# Patient Record
Sex: Male | Born: 1980 | Race: Black or African American | Hispanic: No | Marital: Married | Smoking: Never smoker
Health system: Southern US, Community
[De-identification: ages and names within clinical notes are randomized; demographics above are authoritative.]

---

## 2015-03-12 ENCOUNTER — Inpatient Hospital Stay
Admit: 2015-03-12 | Discharge: 2015-03-12 | Disposition: A | Discharge status: Home / Self Care | Payer: Worker's Compensation | Attending: Emergency Medicine

## 2015-03-12 DIAGNOSIS — S43402A Unspecified sprain of left shoulder joint, initial encounter: Secondary | ICD-10-CM

## 2015-03-12 MED ORDER — ACETAMINOPHEN 325 MG TABLET
325 mg | ORAL | Status: AC
Start: 2015-03-12 — End: 2015-03-12
  Administered 2015-03-12: 15:00:00 via ORAL

## 2015-03-12 MED ORDER — NAPROXEN 250 MG TAB
250 mg | ORAL | Status: AC
Start: 2015-03-12 — End: 2015-03-12
  Administered 2015-03-12: 15:00:00 via ORAL

## 2015-03-12 MED ORDER — METHOCARBAMOL 750 MG TAB
750 mg | ORAL | Status: AC
Start: 2015-03-12 — End: 2015-03-12
  Administered 2015-03-12: 15:00:00 via ORAL

## 2015-03-12 MED ORDER — METHOCARBAMOL 750 MG TAB
750 mg | ORAL_TABLET | Freq: Four times a day (QID) | ORAL | 0 refills | Status: AC | PRN
Start: 2015-03-12 — End: ?

## 2015-03-12 MED ORDER — NAPROXEN 500 MG TAB
500 mg | ORAL_TABLET | Freq: Two times a day (BID) | ORAL | 0 refills | Status: AC
Start: 2015-03-12 — End: 2015-03-22

## 2015-03-12 MED FILL — NAPROXEN 250 MG TAB: 250 mg | ORAL | Qty: 2

## 2015-03-12 MED FILL — ACETAMINOPHEN 325 MG TABLET: 325 mg | ORAL | Qty: 3

## 2015-03-12 MED FILL — METHOCARBAMOL 750 MG TAB: 750 mg | ORAL | Qty: 1
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
## 2015-03-12 NOTE — ED Notes (Signed)
11:43 AM  03/12/15     Discharge instructions given to patient (name) with verbalization of understanding. Patient accompanied by self.  Patient discharged with the following prescriptions (robaxin, naproxen).  Patient discharged to home and ambulated off unit without incident   Teofilo Pod, RN

## 2015-03-12 NOTE — ED Triage Notes (Signed)
Patient complains of left shoulder injury this morning while tightening down on a bolt and it snapped pulling his left arm forward injuring his left shoulder.

## 2020-01-25 ENCOUNTER — Encounter: Payer: Self-pay | Admitting: *Deleted

## 2020-01-25 ENCOUNTER — Other Ambulatory Visit: Payer: Self-pay

## 2020-01-25 ENCOUNTER — Emergency Department: Payer: Worker's Compensation

## 2020-01-25 ENCOUNTER — Emergency Department
Admission: EM | Admit: 2020-01-25 | Discharge: 2020-01-26 | Disposition: A | Discharge status: Home / Self Care | Payer: Worker's Compensation | Attending: Emergency Medicine | Admitting: Emergency Medicine

## 2020-01-25 DIAGNOSIS — M25512 Pain in left shoulder: Secondary | ICD-10-CM | POA: Insufficient documentation

## 2020-01-25 DIAGNOSIS — M542 Cervicalgia: Secondary | ICD-10-CM | POA: Insufficient documentation

## 2020-01-25 MED ORDER — IBUPROFEN 800 MG PO TABS
800.0000 mg | ORAL_TABLET | Freq: Once | ORAL | Status: AC
  Administered 2020-01-25: 800 mg via ORAL
  Filled 2020-01-25: qty 1
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
## 2020-01-25 NOTE — Discharge Instructions (Signed)
Your x-ray showed no fracture or dislocation.  You may alternate Tylenol 1000 mg every 6 hours as needed for pain and Ibuprofen 800 mg every 8 hours as needed for pain.  Please take Ibuprofen with food.  Do not take more than 4000 mg of Tylenol (acetaminophen) in a 24 hour period.
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
